# Patient Record
Sex: Female | Born: 1990 | Race: White | Hispanic: No | Marital: Single | Smoking: Never smoker
Health system: Southern US, Community
[De-identification: ages and names within clinical notes are randomized; demographics above are authoritative.]

## PROBLEM LIST (undated history)

## (undated) DIAGNOSIS — Z789 Other specified health status: Secondary | ICD-10-CM

## (undated) HISTORY — PX: NO PAST SURGERIES: SHX2092

---

## 2012-10-07 ENCOUNTER — Emergency Department (HOSPITAL_COMMUNITY): Payer: 59

## 2012-10-07 ENCOUNTER — Encounter (HOSPITAL_COMMUNITY): Payer: Self-pay | Admitting: Cardiology

## 2012-10-07 ENCOUNTER — Inpatient Hospital Stay (HOSPITAL_COMMUNITY)
Admission: EM | Admit: 2012-10-07 | Discharge: 2012-10-10 | DRG: 076 | Disposition: A | Discharge status: Home / Self Care | Payer: 59 | Attending: Internal Medicine | Admitting: Internal Medicine

## 2012-10-07 DIAGNOSIS — Z23 Encounter for immunization: Secondary | ICD-10-CM

## 2012-10-07 DIAGNOSIS — A879 Viral meningitis, unspecified: Principal | ICD-10-CM | POA: Diagnosis present

## 2012-10-07 DIAGNOSIS — G039 Meningitis, unspecified: Secondary | ICD-10-CM

## 2012-10-07 DIAGNOSIS — G030 Nonpyogenic meningitis: Secondary | ICD-10-CM

## 2012-10-07 DIAGNOSIS — E876 Hypokalemia: Secondary | ICD-10-CM | POA: Diagnosis present

## 2012-10-07 HISTORY — DX: Other specified health status: Z78.9

## 2012-10-07 LAB — CBC WITH DIFFERENTIAL/PLATELET
Eosinophils Relative: 0 % (ref 0–5)
HCT: 38.2 % (ref 36.0–46.0)
Hemoglobin: 12.9 g/dL (ref 12.0–15.0)
Lymphocytes Relative: 12 % (ref 12–46)
Lymphs Abs: 1.3 10*3/uL (ref 0.7–4.0)
MCV: 94.1 fL (ref 78.0–100.0)
Monocytes Absolute: 0.5 10*3/uL (ref 0.1–1.0)
Monocytes Relative: 5 % (ref 3–12)
RBC: 4.06 MIL/uL (ref 3.87–5.11)
RDW: 12.4 % (ref 11.5–15.5)
WBC: 10.1 10*3/uL (ref 4.0–10.5)

## 2012-10-07 LAB — URINALYSIS, ROUTINE W REFLEX MICROSCOPIC
Glucose, UA: NEGATIVE mg/dL
Hgb urine dipstick: NEGATIVE
Ketones, ur: NEGATIVE mg/dL
Protein, ur: NEGATIVE mg/dL

## 2012-10-07 LAB — BASIC METABOLIC PANEL WITH GFR
BUN: 7 mg/dL (ref 6–23)
CO2: 24 meq/L (ref 19–32)
Calcium: 8.7 mg/dL (ref 8.4–10.5)
Chloride: 104 meq/L (ref 96–112)
Creatinine, Ser: 0.6 mg/dL (ref 0.50–1.10)
GFR calc Af Amer: >90 mL/min
GFR calc non Af Amer: >90 mL/min
Glucose, Bld: 130 mg/dL — ABNORMAL HIGH (ref 70–99)
Potassium: 3.4 meq/L — ABNORMAL LOW (ref 3.5–5.1)
Sodium: 139 meq/L (ref 135–145)

## 2012-10-07 LAB — URINE MICROSCOPIC-ADD ON

## 2012-10-07 MED ORDER — KETOROLAC TROMETHAMINE 30 MG/ML IJ SOLN
30.0000 mg | Freq: Once | INTRAMUSCULAR | Status: AC
  Administered 2012-10-07: 30 mg via INTRAVENOUS
  Filled 2012-10-07: qty 1

## 2012-10-07 MED ORDER — DEXAMETHASONE SODIUM PHOSPHATE 10 MG/ML IJ SOLN
10.0000 mg | Freq: Once | INTRAMUSCULAR | Status: AC
  Administered 2012-10-07: 10 mg via INTRAVENOUS
  Filled 2012-10-07: qty 1

## 2012-10-07 MED ORDER — DIPHENHYDRAMINE HCL 50 MG/ML IJ SOLN
25.0000 mg | Freq: Once | INTRAMUSCULAR | Status: AC
  Administered 2012-10-07: 25 mg via INTRAVENOUS
  Filled 2012-10-07: qty 1

## 2012-10-07 MED ORDER — METOCLOPRAMIDE HCL 5 MG/ML IJ SOLN
10.0000 mg | Freq: Once | INTRAMUSCULAR | Status: AC
  Administered 2012-10-07: 10 mg via INTRAVENOUS
  Filled 2012-10-07: qty 2

## 2012-10-07 MED ORDER — SODIUM CHLORIDE 0.9 % IV SOLN
INTRAVENOUS | Status: DC
  Administered 2012-10-07: 20:00:00 via INTRAVENOUS

## 2012-10-07 MED ORDER — ONDANSETRON HCL 4 MG/2ML IJ SOLN
4.0000 mg | Freq: Once | INTRAMUSCULAR | Status: AC
  Administered 2012-10-07: 4 mg via INTRAVENOUS
  Filled 2012-10-07: qty 2

## 2012-10-07 NOTE — Progress Notes (Signed)
11:14 PM 22 yo woman with severe headache, neck pain, photophobia since yesterday after getting off an [employer].  No fever or chills.  No prior headache history.  General health good.  Exam shows her to be in moderate distress with headache, which she localizes to the frontal region radiating to the occipital region and into her posterior neck.  Movement of her neck causes pain.  Heart and lungs normal, abdomen soft and non-tender.  Neur:  Cr. N. Intact.  Sensory and motor intact.  Will plan to do LP to check for meningitis.  Medicated further with Reglan, dexamethasone, and Benadryl.

## 2012-10-07 NOTE — ED Notes (Signed)
Pt reports headache with n/v that started last night. Reports she has some dizziness, but no vision changes. States she feels off balance when she stands up. Steady gait to triage.

## 2012-10-07 NOTE — ED Notes (Signed)
Pt states pain is not better.  np aware new order obtained.

## 2012-10-07 NOTE — ED Provider Notes (Signed)
History     CSN: [id number]  Arrival date & time 10/07/12  1743   First MD Initiated Contact with Patient 10/07/12 1855      Chief Complaint  Patient presents with  . Headache  . Nausea  . Emesis    (Consider location/radiation/quality/duration/timing/severity/associated sxs/prior treatment) Patient is a 22 y.o. female presenting with headaches and vomiting. The history is provided by the patient. No language interpreter was used.  Headache Pain location:  Frontal Radiates to: neck. Onset quality:  Gradual Duration:  18 hours Timing:  Constant Progression:  Worsening Chronicity:  New Similar to prior headaches: no   Context: bright light   Relieved by:  None tried Ineffective treatments:  None tried Associated symptoms: dizziness, fatigue, nausea, neck pain, neck stiffness, photophobia and vomiting   Associated symptoms: no fever and no swollen glands   Emesis Associated symptoms: headaches     History reviewed. No pertinent past medical history.  History reviewed. No pertinent past surgical history.  History reviewed. No pertinent family history.  History  Substance Use Topics  . Smoking status: Never Smoker   . Smokeless tobacco: Not on file  . Alcohol Use: Yes    OB History   Grav Para Term Preterm Abortions TAB SAB Ect Mult Living                  Review of Systems  Constitutional: Positive for fatigue. Negative for fever.  HENT: Positive for neck pain and neck stiffness.   Eyes: Positive for photophobia.  Gastrointestinal: Positive for nausea and vomiting.  Skin: Negative for rash.  Neurological: Positive for dizziness and headaches.  All other systems reviewed and are negative.    Allergies  Review of patient's allergies indicates no known allergies.  Home Medications   Current Outpatient Rx  Name  Route  Sig  Dispense  Refill  . aspirin-acetaminophen-caffeine (EXCEDRIN MIGRAINE) 250-250-65 MG per tablet   Oral   Take 1 tablet by mouth  every 6 (six) hours as needed for pain. For headache pain           BP 141/88  Pulse 86  Temp(Src) 98.4 F (36.9 C) (Oral)  Resp 20  SpO2 97%  Physical Exam  Nursing note and vitals reviewed. Constitutional: She is oriented to person, place, and time. She appears well-developed and well-nourished.  HENT:  Head: Normocephalic and atraumatic.  Eyes: Pupils are equal, round, and reactive to light.  Neck: Normal range of motion.  Increased pain to posterior neck when touching chin to chest  Cardiovascular: Normal rate.   Pulmonary/Chest: Effort normal. She has wheezes.  Abdominal: Soft. Bowel sounds are normal.  Musculoskeletal: Normal range of motion. She exhibits no edema and no tenderness.  Lymphadenopathy:    She has no cervical adenopathy.  Neurological: She is alert and oriented to person, place, and time. No cranial nerve deficit.  Skin: Skin is warm and dry. No rash noted.  Psychiatric: She has a normal mood and affect. Her behavior is normal. Judgment and thought content normal.    ED Course  Procedures (including critical care time)  Labs Reviewed  CBC WITH DIFFERENTIAL  BASIC METABOLIC PANEL  URINALYSIS, ROUTINE W REFLEX MICROSCOPIC   No results found.   No diagnosis found.  Patient discussed with and seen by Dr. Ignacia Palma.  Patient will need LP.  Attempt in ED unsuccessful, radiology to perform under flouro guidance.  MDM          Jimmye Norman, NP  10/08/12 0108 

## 2012-10-08 ENCOUNTER — Encounter (HOSPITAL_COMMUNITY): Payer: Self-pay | Admitting: Internal Medicine

## 2012-10-08 ENCOUNTER — Emergency Department (HOSPITAL_COMMUNITY): Payer: 59

## 2012-10-08 DIAGNOSIS — A879 Viral meningitis, unspecified: Secondary | ICD-10-CM

## 2012-10-08 DIAGNOSIS — E876 Hypokalemia: Secondary | ICD-10-CM

## 2012-10-08 DIAGNOSIS — G030 Nonpyogenic meningitis: Secondary | ICD-10-CM | POA: Diagnosis present

## 2012-10-08 LAB — CSF CELL COUNT WITH DIFFERENTIAL
Eosinophils, CSF: 1 % (ref 0–1)
Lymphs, CSF: 79 % (ref 40–80)
Monocyte-Macrophage-Spinal Fluid: 11 % — ABNORMAL LOW (ref 15–45)
RBC Count, CSF: 13 /mm3 — ABNORMAL HIGH
Segmented Neutrophils-CSF: 5 % (ref 0–6)
Tube #: 4

## 2012-10-08 LAB — GRAM STAIN

## 2012-10-08 MED ORDER — DEXTROSE 5 % IV SOLN
2.0000 g | Freq: Once | INTRAVENOUS | Status: AC
  Administered 2012-10-08: 2 g via INTRAVENOUS
  Filled 2012-10-08: qty 2

## 2012-10-08 MED ORDER — ACETAMINOPHEN 325 MG PO TABS
650.0000 mg | ORAL_TABLET | Freq: Four times a day (QID) | ORAL | Status: DC | PRN
  Administered 2012-10-08 – 2012-10-09 (×3): 650 mg via ORAL
  Filled 2012-10-08: qty 1
  Filled 2012-10-08: qty 2

## 2012-10-08 MED ORDER — ONDANSETRON HCL 4 MG/2ML IJ SOLN: 4.0000 mg | Freq: Four times a day (QID) | INTRAMUSCULAR | Status: DC | PRN

## 2012-10-08 MED ORDER — VANCOMYCIN HCL IN DEXTROSE 1-5 GM/200ML-% IV SOLN
1000.0000 mg | Freq: Once | INTRAVENOUS | Status: AC
  Administered 2012-10-08: 1000 mg via INTRAVENOUS
  Filled 2012-10-08: qty 200

## 2012-10-08 MED ORDER — ACETAMINOPHEN 325 MG PO TABS
975.0000 mg | ORAL_TABLET | Freq: Four times a day (QID) | ORAL | Status: DC | PRN
  Filled 2012-10-08: qty 3

## 2012-10-08 MED ORDER — POTASSIUM CHLORIDE CRYS ER 20 MEQ PO TBCR
40.0000 meq | EXTENDED_RELEASE_TABLET | Freq: Once | ORAL | Status: AC
  Administered 2012-10-08: 40 meq via ORAL
  Filled 2012-10-08: qty 2

## 2012-10-08 MED ORDER — ACETAMINOPHEN 650 MG RE SUPP: 650.0000 mg | Freq: Four times a day (QID) | RECTAL | Status: DC | PRN

## 2012-10-08 MED ORDER — INFLUENZA VIRUS VACC SPLIT PF IM SUSP: 0.5000 mL | INTRAMUSCULAR | Status: DC | PRN

## 2012-10-08 MED ORDER — SODIUM CHLORIDE 0.9 % IV SOLN
INTRAVENOUS | Status: DC
  Administered 2012-10-08: 06:00:00 via INTRAVENOUS

## 2012-10-08 MED ORDER — ALBUTEROL SULFATE (5 MG/ML) 0.5% IN NEBU: 2.5000 mg | INHALATION_SOLUTION | RESPIRATORY_TRACT | Status: DC | PRN

## 2012-10-08 MED ORDER — SODIUM CHLORIDE 0.9 % IV SOLN
INTRAVENOUS | Status: DC
  Administered 2012-10-08: 19:00:00 via INTRAVENOUS

## 2012-10-08 MED ORDER — ONDANSETRON HCL 4 MG PO TABS: 4.0000 mg | ORAL_TABLET | Freq: Four times a day (QID) | ORAL | Status: DC | PRN

## 2012-10-08 NOTE — Procedures (Signed)
Lumbar puncture performed at L3-L4 interspace.  Opening pressure 20 cm H20.  No complications.  Full dictation in PACS.

## 2012-10-08 NOTE — ED Provider Notes (Signed)
Medical screening examination/treatment/procedure(s) were conducted as a shared visit with non-physician practitioner(s) and myself.  I personally evaluated the patient during the encounter 22 yo woman with severe headache, neck pain, photophobia since yesterday after getting off an airplane flight. No fever or chills. No prior headache history. General health good. Exam shows her to be in moderate distress with headache, which she localizes to the frontal region radiating to the occipital region and into her posterior neck. Movement of her neck causes pain. Heart and lungs normal, abdomen soft and non-tender. Neur: Cr. N. Intact. Sensory and motor intact. Will plan to do LP to check for meningitis. Medicated further with Reglan, dexamethasone, and Benadryl.    Carleene Cooper III, MD 10/08/12 [phone]

## 2012-10-08 NOTE — ED Provider Notes (Signed)
Patient care assumed from Dr. Ignacia Palma.  He had attempted lumbar puncture for meningismus, and unable and sent patient to radiology to have it done under fluoroscopy. Patient is symptomatic with headache and neck stiffness or 24 hours. No fevers. No rash. No known sick contacts. Radiologist was able to obtain CSF and results reviewed as below. IV antibiotics initiated. Medicine consult.  Results for orders placed during the hospital encounter of 10/07/12  CBC WITH DIFFERENTIAL      Result Value Range   WBC 10.1  4.0 - 10.5 K/uL   RBC 4.06  3.87 - 5.11 MIL/uL   Hemoglobin 12.9  12.0 - 15.0 g/dL   HCT 16.1  09.6 - 04.5 %   MCV 94.1  78.0 - 100.0 fL   MCH 31.8  26.0 - 34.0 pg   MCHC 33.8  30.0 - 36.0 g/dL   RDW 40.9  81.1 - 91.4 %   Platelets 300  150 - 400 K/uL   Neutrophils Relative 82 (*) 43 - 77 %   Neutro Abs 8.3 (*) 1.7 - 7.7 K/uL   Lymphocytes Relative 12  12 - 46 %   Lymphs Abs 1.3  0.7 - 4.0 K/uL   Monocytes Relative 5  3 - 12 %   Monocytes Absolute 0.5  0.1 - 1.0 K/uL   Eosinophils Relative 0  0 - 5 %   Eosinophils Absolute 0.0  0.0 - 0.7 K/uL   Basophils Relative 0  0 - 1 %   Basophils Absolute 0.0  0.0 - 0.1 K/uL  BASIC METABOLIC PANEL      Result Value Range   Sodium 139  135 - 145 mEq/L   Potassium 3.4 (*) 3.5 - 5.1 mEq/L   Chloride 104  96 - 112 mEq/L   CO2 24  19 - 32 mEq/L   Glucose, Bld 130 (*) 70 - 99 mg/dL   BUN 7  6 - 23 mg/dL   Creatinine, Ser 7.82  0.50 - 1.10 mg/dL   Calcium 8.7  8.4 - 95.6 mg/dL   GFR calc non Af Amer >90  >90 mL/min   GFR calc Af Amer >90  >90 mL/min  URINALYSIS, ROUTINE W REFLEX MICROSCOPIC      Result Value Range   Color, Urine YELLOW  YELLOW   APPearance CLEAR  CLEAR   Specific Gravity, Urine 1.022  1.005 - 1.030   pH 7.5  5.0 - 8.0   Glucose, UA NEGATIVE  NEGATIVE mg/dL   Hgb urine dipstick NEGATIVE  NEGATIVE   Bilirubin Urine NEGATIVE  NEGATIVE   Ketones, ur NEGATIVE  NEGATIVE mg/dL   Protein, ur NEGATIVE  NEGATIVE mg/dL    Urobilinogen, UA 1.0  0.0 - 1.0 mg/dL   Nitrite NEGATIVE  NEGATIVE   Leukocytes, UA TRACE (*) NEGATIVE  URINE MICROSCOPIC-ADD ON      Result Value Range   Squamous Epithelial / LPF FEW (*) RARE   WBC, UA 0-2  <3 WBC/hpf   Bacteria, UA FEW (*) RARE   Urine-Other AMORPHOUS URATES/PHOSPHATES    CSF CELL COUNT WITH DIFFERENTIAL      Result Value Range   Tube # 1     Color, CSF COLORLESS  COLORLESS   Appearance, CSF CLEAR  CLEAR   Supernatant NOT INDICATED     RBC Count, CSF 215 (*) 0 /cu mm   WBC, CSF 470 (*) 0 - 5 /cu mm   Segmented Neutrophils-CSF 5  0 - 6 %   Lymphs,  CSF 83 (*) 40 - 80 %   Monocyte-Macrophage-Spinal Fluid 11 (*) 15 - 45 %   Eosinophils, CSF 1  0 - 1 %  PROTEIN AND GLUCOSE, CSF      Result Value Range   Glucose, CSF 58  43 - 76 mg/dL   Total  Protein, CSF 76 (*) 15 - 45 mg/dL  CSF CELL COUNT WITH DIFFERENTIAL      Result Value Range   Tube # 4     Color, CSF COLORLESS  COLORLESS   Appearance, CSF CLEAR  CLEAR   Supernatant NOT INDICATED     RBC Count, CSF 13 (*) 0 /cu mm   WBC, CSF 490 (*) 0 - 5 /cu mm   Segmented Neutrophils-CSF 4  0 - 6 %   Lymphs, CSF 79  40 - 80 %   Monocyte-Macrophage-Spinal Fluid 16  15 - 45 %   Eosinophils, CSF 1  0 - 1 %  POCT PREGNANCY, URINE      Result Value Range   Preg Test, Ur NEGATIVE  NEGATIVE   Ct Head Wo Contrast  10/07/2012  *RADIOLOGY REPORT*  Clinical Data: Headache, nausea and emesis.  CT HEAD WITHOUT CONTRAST  Technique:  Contiguous axial images were obtained from the base of the skull through the vertex without contrast.  Comparison: No priors.  Findings: No acute intracranial abnormalities.  Specifically, no evidence of acute intracranial hemorrhage, no definite findings of acute/subacute cerebral ischemia, no mass, mass effect, hydrocephalus or abnormal intra or extra-axial fluid collections. Visualized paranasal sinuses and mastoids are well pneumatized.  No acute displaced skull fractures are identified.   IMPRESSION: 1.  No acute intracranial abnormality. 2.  The appearance of the brain is normal.   Original Report Authenticated By: Trudie Reed, M.D.    Dg Lumbar Puncture Fluoro Guide  10/08/2012  *RADIOLOGY REPORT*  Clinical Data: Severe headache and neck stiffness.  LUMBAR PUNCTURE FLUORO GUIDE  Comparison: No priors.  Findings: Before beginning the procedure, the procedure was discussed with the patient.  The risks, benefits and alternatives of the procedure were explained, the patient voiced understanding of the discussion.  The patient gave both written and verbal consent to proceed. A time out procedure was then performed.  Subsequently, fluoroscopy was utilized to identify the L3-L4 interspace, and a small mark was placed on the patient's skin.  The patient was then prepped and draped in the usual sterile fashion. A small amount of 1% lidocaine was infiltrated to provide local anesthesia.  Under intermittent fluoroscopic guidance a spinal needle was then introduced percutaneously into the spinal canal and CSF was obtained.  Opening pressure was 20 cm of water.  A total of 18 ml of clear CSF was collected in serial vials and sent to the laboratory for testing analysis per orders of the ER physician. The needle was withdrawn from the patient.  A small bandage was placed on the patient's skin overlying the entry wound.  No significant bleeding, or other immediate complications.  The patient was transferred back to the emergency room in good condition.  IMPRESSION: 1.  Successful uncomplicated fluoroscopic guided lumbar puncture, as above.   Original Report Authenticated By: Trudie Reed, M.D.     CRITICAL CARE Performed by: Sunnie Nielsen   Total critical care time: 30  Critical care time was exclusive of separately billable procedures and treating other patients.  Critical care was necessary to treat or prevent imminent or life-threatening deterioration.  Critical care was time spent  personally by me on the following activities: development of treatment plan with patient and/or surrogate as well as nursing, discussions with consultants, evaluation of patient's response to treatment, examination of patient, obtaining history from patient or surrogate, ordering and performing treatments and interventions, ordering and review of laboratory studies, ordering and review of radiographic studies, pulse oximetry and re-evaluation of patient's condition. Case discussed as above with infectious disease Dr. Ilsa Iha - she recommends add HSV to CSF culture and agrees with IV antibiotics Rocephin and vancomycin.   Gram stain pending, CSF culture pending. Medicine consult for admission   Sunnie Nielsen, MD 10/08/12 0730

## 2012-10-08 NOTE — ED Notes (Signed)
Pt ambulatory to br

## 2012-10-08 NOTE — Procedures (Signed)
Pt has 24 hour of headache, neck pain and photophobia.  Lab tests and CT were nondiagnostic.  Pt and her parents were advised by me to have diagnostic lumbar puncture to check for meningitis, and they gave verbal consent.  Skin prepped with chlorhexadine.  LP needle inserted at appx L4-5 interspace, but only blood came back.  After this failed attempt, I requested Dr. Llana Aliment, radiologist, to perform the LP under flouroscopy.

## 2012-10-08 NOTE — Progress Notes (Signed)
Patient arrived from 2600, Alert and oriented and Oriented to the rooms, falls protocol, and droplet isolation. No concerns at this time

## 2012-10-08 NOTE — Consult Note (Addendum)
[facility]  Total days of antibiotics 1        Day 1 ceftriaxone        Day 1 vanco               Reason for Consult:aseptic meningitis    Referring Physician: [email or web address]  Principal Problem:   Aseptic meningitis Active Problems:   Hypokalemia    HPI: Joy Fisher is a 22 y.o. 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 No prior hx of migraines. She had recent antibiotic exposure roughly 7 days ago treated for URTI with what sounds like a zpack from 2/12-2/15. She  Returned on 2/20 from a 4 day trip to [facility] . She denies sick contacts. She was taking 400mg  Ibuprofen on daily basis x 3 days for sore ankle. While on the plane returning back home on Thursday night, she started experiencing gradual onset of frontal headache which progressively got worse yesterday to 8/10 in severity and radiated to the rest of the head, neck and back. She took excedrin which helped her symptoms x 2 hrs. But then HA came back. This was associated with photophobia.. One episode of vomiting which was nonprojectile and did not have coffee-ground or blood.  She denies earache, sore throat, cough, skin rash or insect bites. On admit, she was afebrile, WBC, at 10 with 81N, underwent NCHCT, attempted bedside LP but eventually had LP by IR under fluoroscopy with OP of 20, cell count shows WBC 470 with 79%L, TP 76, glu 56/while pGlu at 130. Gram stain was negative for organisms.She was started on vancomycin and ceftriaxone. . Patient says that her headache is much better. She complains of mild pain at the LP site. Denies any recent cold sore, althgouh had one associated with sunburn roughly 2 yrs ago. No hx of hsv infection.   Past Medical History  Diagnosis Date  . Medical history non-contributory   - adhd, prn adderal  Allergies: No Known Allergies  MEDICATIONS:    History  Substance Use  Topics  . Smoking status: Never Smoker   . Smokeless tobacco: Not on file  . Alcohol Use: Yes    Family History  Problem Relation Age of Onset  . CVA Father     Review of Systems  Constitutional: Negative for fever, chills, diaphoresis, activity change, appetite change, fatigue and unexpected weight change.  HENT: Negative for congestion, sore throat, rhinorrhea, sneezing, trouble swallowing and sinus pressure.  Eyes: Negative for photophobia and visual disturbance.  Respiratory: Negative for cough, chest tightness, shortness of breath, wheezing and stridor.  Cardiovascular: Negative for chest pain, palpitations and leg swelling.  Gastrointestinal: Negative for nausea, vomiting, abdominal pain, diarrhea, constipation, blood in stool, abdominal distention and anal bleeding.  Genitourinary: Negative for dysuria, hematuria, flank pain and difficulty urinating.  Musculoskeletal: neck pain per hpi Skin: Negative for color change, pallor, rash and wound.  Neurological: HA per hpi  Hematological: Negative for adenopathy. Does not bruise/bleed easily.  Psychiatric/Behavioral: Negative for behavioral problems, confusion, sleep disturbance, dysphoric mood, decreased concentration and agitation.     OBJECTIVE: Temp:  [98.4 F (36.9 C)-99.2 F (37.3 C)] 98.6 F (37 C) (02/22 0827) Pulse Rate:  [60-94] 75 (02/22 1100) Resp:  [16-29] 22 (02/22 1100) BP: (111-141)/(49-88) 125/67 mmHg (02/22 0827) SpO2:  [96 %-100 %] 96 % (02/22 1100) Weight:  [186 lb 11.7 oz (84.7 kg)] 186  lb 11.7 oz (84.7 kg) (02/22 4782) Physical Exam  Constitutional:  oriented to person, place, and time. appears well-developed and well-nourished. No distress.  HENT:  Mouth/Throat: Oropharynx is clear and moist. No oropharyngeal exudate. No nuchal rigidity Cardiovascular: Normal rate, regular rhythm and normal heart sounds. Exam reveals no gallop and no friction rub.  No murmur heard.  Pulmonary/Chest: Effort normal and  breath sounds normal. No respiratory distress.no wheezes.  Abdominal: Soft. Bowel sounds are normal. exhibits no distension. There is no tenderness.  Lymphadenopathy:  no cervical adenopathy.  Neurological: He is alert and oriented to person, place, and time.  Skin: Skin is warm and dry. No rash noted. No erythema.  Psychiatric:  normal mood and affect. His behavior is normal.    LABS: Results for orders placed during the hospital encounter of 10/07/12 (from the past 48 hour(s))  CBC WITH DIFFERENTIAL     Status: Abnormal   Collection Time    10/07/12  7:14 PM      Result Value Range   WBC 10.1  4.0 - 10.5 K/uL   RBC 4.06  3.87 - 5.11 MIL/uL   Hemoglobin 12.9  12.0 - 15.0 g/dL   HCT 95.6  21.3 - 08.6 %   MCV 94.1  78.0 - 100.0 fL   MCH 31.8  26.0 - 34.0 pg   MCHC 33.8  30.0 - 36.0 g/dL   RDW 57.8  46.9 - 62.9 %   Platelets 300  150 - 400 K/uL   Neutrophils Relative 82 (*) 43 - 77 %   Neutro Abs 8.3 (*) 1.7 - 7.7 K/uL   Lymphocytes Relative 12  12 - 46 %   Lymphs Abs 1.3  0.7 - 4.0 K/uL   Monocytes Relative 5  3 - 12 %   Monocytes Absolute 0.5  0.1 - 1.0 K/uL   Eosinophils Relative 0  0 - 5 %   Eosinophils Absolute 0.0  0.0 - 0.7 K/uL   Basophils Relative 0  0 - 1 %   Basophils Absolute 0.0  0.0 - 0.1 K/uL  BASIC METABOLIC PANEL     Status: Abnormal   Collection Time    10/07/12  7:14 PM      Result Value Range   Sodium 139  135 - 145 mEq/L   Potassium 3.4 (*) 3.5 - 5.1 mEq/L   Chloride 104  96 - 112 mEq/L   CO2 24  19 - 32 mEq/L   Glucose, Bld 130 (*) 70 - 99 mg/dL   BUN 7  6 - 23 mg/dL   Creatinine, Ser 5.28  0.50 - 1.10 mg/dL   Calcium 8.7  8.4 - 41.3 mg/dL   GFR calc non Af Amer >90  >90 mL/min   GFR calc Af Amer >90  >90 mL/min   Comment:            The eGFR has been calculated     using the CKD EPI equation.     This calculation has not been     validated in all clinical     situations.     eGFR's persistently     <90 mL/min signify     possible Chronic  Kidney Disease.  URINALYSIS, ROUTINE W REFLEX MICROSCOPIC     Status: Abnormal   Collection Time    10/07/12  7:32 PM      Result Value Range   Color, Urine YELLOW  YELLOW   APPearance CLEAR  CLEAR   Specific  Gravity, Urine 1.022  1.005 - 1.030   pH 7.5  5.0 - 8.0   Glucose, UA NEGATIVE  NEGATIVE mg/dL   Hgb urine dipstick NEGATIVE  NEGATIVE   Bilirubin Urine NEGATIVE  NEGATIVE   Ketones, ur NEGATIVE  NEGATIVE mg/dL   Protein, ur NEGATIVE  NEGATIVE mg/dL   Urobilinogen, UA 1.0  0.0 - 1.0 mg/dL   Nitrite NEGATIVE  NEGATIVE   Leukocytes, UA TRACE (*) NEGATIVE  URINE MICROSCOPIC-ADD ON     Status: Abnormal   Collection Time    10/07/12  7:32 PM      Result Value Range   Squamous Epithelial / LPF FEW (*) RARE   WBC, UA 0-2  <3 WBC/hpf   Bacteria, UA FEW (*) RARE   Urine-Other AMORPHOUS URATES/PHOSPHATES    POCT PREGNANCY, URINE     Status: None   Collection Time    10/07/12  7:38 PM      Result Value Range   Preg Test, Ur NEGATIVE  NEGATIVE   Comment:            THE SENSITIVITY OF THIS     METHODOLOGY IS >24 mIU/mL  PROTEIN AND GLUCOSE, CSF     Status: Abnormal   Collection Time    10/08/12  2:54 AM      Result Value Range   Glucose, CSF 58  43 - 76 mg/dL   Total  Protein, CSF 76 (*) 15 - 45 mg/dL  GRAM STAIN     Status: None   Collection Time    10/08/12  2:54 AM      Result Value Range   Specimen Description CSF     Special Requests NONE     Gram Stain       Value: CYTOSPIN SAMPLE     WBC PRESENT,BOTH PMN AND MONONUCLEAR     NO ORGANISMS SEEN   Report Status 10/08/2012 FINAL    CSF CELL COUNT WITH DIFFERENTIAL     Status: Abnormal   Collection Time    10/08/12  2:58 AM      Result Value Range   Tube # 1     Color, CSF COLORLESS  COLORLESS   Appearance, CSF CLEAR  CLEAR   Supernatant NOT INDICATED     RBC Count, CSF 215 (*) 0 /cu mm   WBC, CSF 470 (*) 0 - 5 /cu mm   Comment: CRITICAL RESULT CALLED TO, READ BACK BY AND VERIFIED WITH:     Vanessa Kick [id number]  10/08/12 D BRADLEY   Segmented Neutrophils-CSF 5  0 - 6 %   Lymphs, CSF 83 (*) 40 - 80 %   Monocyte-Macrophage-Spinal Fluid 11 (*) 15 - 45 %   Eosinophils, CSF 1  0 - 1 %  CSF CELL COUNT WITH DIFFERENTIAL     Status: Abnormal   Collection Time    10/08/12  2:58 AM      Result Value Range   Tube # 4     Color, CSF COLORLESS  COLORLESS   Appearance, CSF CLEAR  CLEAR   Supernatant NOT INDICATED     RBC Count, CSF 13 (*) 0 /cu mm   WBC, CSF 490 (*) 0 - 5 /cu mm   Comment: CRITICAL RESULT CALLED TO, READ BACK BY AND VERIFIED WITH:     J MUNCY,RN 1191 10/08/12 D BRADLEY   Segmented Neutrophils-CSF 4  0 - 6 %   Lymphs, CSF 79  40 - 80 %  Monocyte-Macrophage-Spinal Fluid 16  15 - 45 %   Eosinophils, CSF 1  0 - 1 %  MRSA PCR SCREENING     Status: None   Collection Time    10/08/12  6:30 AM      Result Value Range   MRSA by PCR NEGATIVE  NEGATIVE   Comment:            The GeneXpert MRSA Assay (FDA     approved for NASAL specimens     only), is one component of a     comprehensive MRSA colonization     surveillance program. It is not     intended to diagnose MRSA     infection nor to guide or     monitor treatment for     MRSA infections.    MICRO: 2/22 csf :gram stain negative; culture pending  IMAGING: Ct Head Wo Contrast  10/07/2012  *RADIOLOGY REPORT*  Clinical Data: Headache, nausea and emesis.  CT HEAD WITHOUT CONTRAST  Technique:  Contiguous axial images were obtained from the base of the skull through the vertex without contrast.  Comparison: No priors.  Findings: No acute intracranial abnormalities.  Specifically, no evidence of acute intracranial hemorrhage, no definite findings of acute/subacute cerebral ischemia, no mass, mass effect, hydrocephalus or abnormal intra or extra-axial fluid collections. Visualized paranasal sinuses and mastoids are well pneumatized.  No acute displaced skull fractures are identified.  IMPRESSION: 1.  No acute intracranial abnormality. 2.  The  appearance of the brain is normal.   Original Report Authenticated By: Trudie Reed, M.D.    Dg Lumbar Puncture Fluoro Guide  10/08/2012  *RADIOLOGY REPORT*  Clinical Data: Severe headache and neck stiffness.  LUMBAR PUNCTURE FLUORO GUIDE  Comparison: No priors.  Findings: Before beginning the procedure, the procedure was discussed with the patient.  The risks, benefits and alternatives of the procedure were explained, the patient voiced understanding of the discussion.  The patient gave both written and verbal consent to proceed. A time out procedure was then performed.  Subsequently, fluoroscopy was utilized to identify the L3-L4 interspace, and a small mark was placed on the patient's skin.  The patient was then prepped and draped in the usual sterile fashion. A small amount of 1% lidocaine was infiltrated to provide local anesthesia.  Under intermittent fluoroscopic guidance a spinal needle was then introduced percutaneously into the spinal canal and CSF was obtained.  Opening pressure was 20 cm of water.  A total of 18 ml of clear CSF was collected in serial vials and sent to the laboratory for testing analysis per orders of the ER physician. The needle was withdrawn from the patient.  A small bandage was placed on the patient's skin overlying the entry wound.  No significant bleeding, or other immediate complications.  The patient was transferred back to the emergency room in good condition.  IMPRESSION: 1.  Successful uncomplicated fluoroscopic guided lumbar puncture, as above.   Original Report Authenticated By: Trudie Reed, M.D.    Assessment/Plan:  22yo F with ongoing headache with meningmus sans fever underwent LP found to have lymphocytic pleocytosis, WBC 470, 79%L, mildly elevated protein, normal glucose. Gram stain negative c/w aseptic meningitis. ddx could be HSV-2, or drug associated aseptic meningitis, as with NSAID use. Not the season for enterovirus or arborvirus or West Nile  Virus  - continue with supportive care - will check HIV ab, RPR with am labs - await HSV PCR of CSF  - can discontinue droplet  isolation - will follow up on culture results to determine if any need for antiviral  Female Minish B. Drue Second MD MPH [facility] [phone]

## 2012-10-08 NOTE — ED Notes (Signed)
Pt returned from radiology. States feeling much better.

## 2012-10-08 NOTE — ED Notes (Signed)
Pt to radiology for lp.

## 2012-10-08 NOTE — H&P (Signed)
[facility] History and Physical  Chapel [name] ZOX:[id number] DOB: 04-03-1991 DOA: 10/07/2012  Referring physician: Dr. Sunnie Nielsen, EDP PCP: Default, Provider, MD  Specialists: None  Chief Complaint: Headache, neck pain and photophobia  HPI: Joy Fisher is a 22 y.o. female  with no PMH, student at [employer], presented to ED on 10/07/12 with one-day history of headache, neck pain and photophobia. She was treated for URTI with a 4 day course of unknown antibiotic approximately a week ago. She then traveled with her classmates to [facility] on a trip for 4 days. She denies sickly contacts. While on the plane returning back home on Thursday night, she started experiencing gradual onset of frontal headache which progressively got worse yesterday to 8/10 in severity and radiated to the rest of the head, neck and back. This was associated with photophobia. No fever or chills. One episode of vomiting which was nonprojectile and did not have coffee-ground or blood. A dose of Excedrin Migraine temporarily eased her symptoms but returned. She denies earache, sore throat, cough, skin rash or insect bites. She presented to the ED where she was afebrile, no leukocytosis, CT head negative. Lumbar puncture was done by IR under fluoroscopy which shows  Glucose 58, protein 76, RBC 215 on first tube and 13 on the fourth 2, WBC 470 predominant lymphocytes and monocytes. Gram stain was negative for organisms. Hospitalist service is requested to admit for further evaluation and management. Patient says that her headache is much better. She complains of mild pain at the LP site. ID was consulted by EDP.   Review of Systems: All systems were reviewed and apart from history of presenting illness, negative .  Past Medical History  Diagnosis Date  . Medical history non-contributory    Past Surgical History  Procedure Laterality Date  . No past surgeries     Social History:  reports that she has never smoked.  She does not have any smokeless tobacco history on file. She reports that  drinks alcohol. She reports that she does not use illicit drugs. Independent of activities of daily living. Occasional alcohol intake.   No Known Allergies  Family History  Problem Relation Age of Onset  . CVA Father     Prior to Admission medications   Medication Sig Start Date End Date Taking? Authorizing Provider  aspirin-acetaminophen-caffeine (EXCEDRIN MIGRAINE) [phone] MG per tablet Take 1 tablet by mouth every 6 (six) hours as needed for pain. For headache pain   Yes Historical Provider, MD   Physical Exam: Filed Vitals:   10/08/12 0303 10/08/12 0435 10/08/12 0620 10/08/12 0827  BP: 112/61 111/49 125/70 125/67  Pulse: 80 66 60 68  Temp:  99.2 F (37.3 C) 98.6 F (37 C) 98.6 F (37 C)  TempSrc:  Oral Oral Oral  Resp: 16 16 16 23   Height:   5\' 5"  (1.651 m)   Weight:   84.7 kg (186 lb 11.7 oz)   SpO2: 99% 99% 98% 97%     General exam: Well built and nourished young female patient lying comfortably supine in bed without any distress.  Head, eyes and ENT: Nontraumatic and normocephalic. Pupils equally reacting to light and accommodation. Mild photophobia. Oral mucosa slightly dry.  Neck: Supple. No JVD, carotid bruits or thyromegaly.  Lymphatics: No lymphadenopathy.  Respiratory system: Clear. No increased work of breathing.  Cardiovascular system: S1 and S2 heard, RRR. No JVD, murmurs, gallops or pedal edema. Telemetry shows normal sinus rhythm.  Gastrointestinal system: Abdomen is nondistended,  soft and nontender. Normal bowel sounds heard.  Central nervous system: Alert and oriented. No focal neurological deficits.  Gastrointestinal system: Abdomen is nondistended, soft and nontender. Normal bowel sounds heard. No organomegaly or masses appreciated.  Extremities: Symmetric 5 x 5 power. If her pulses symmetrically felt.  Skin: No acute findings on examination of exposed  skin.  Musculoskeletal system: LP site without acute findings.  Psychiatry: Pleasant and cooperative.    Labs on Admission:  Basic Metabolic Panel:  Recent Labs Lab 10/07/12 1914  NA 139  K 3.4*  CL 104  CO2 24  GLUCOSE 130*  BUN 7  CREATININE 0.60  CALCIUM 8.7   Liver Function Tests: No results found for this basename: AST, ALT, ALKPHOS, BILITOT, PROT, ALBUMIN,  in the last 168 hours No results found for this basename: LIPASE, AMYLASE,  in the last 168 hours No results found for this basename: AMMONIA,  in the last 168 hours CBC:  Recent Labs Lab 10/07/12 1914  WBC 10.1  NEUTROABS 8.3*  HGB 12.9  HCT 38.2  MCV 94.1  PLT 300   Cardiac Enzymes: No results found for this basename: CKTOTAL, CKMB, CKMBINDEX, TROPONINI,  in the last 168 hours  BNP (last 3 results) No results found for this basename: PROBNP,  in the last 8760 hours CBG: No results found for this basename: GLUCAP,  in the last 168 hours  Radiological Exams on Admission: Ct Head Wo Contrast  10/07/2012  *RADIOLOGY REPORT*  Clinical Data: Headache, nausea and emesis.  CT HEAD WITHOUT CONTRAST  Technique:  Contiguous axial images were obtained from the base of the skull through the vertex without contrast.  Comparison: No priors.  Findings: No acute intracranial abnormalities.  Specifically, no evidence of acute intracranial hemorrhage, no definite findings of acute/subacute cerebral ischemia, no mass, mass effect, hydrocephalus or abnormal intra or extra-axial fluid collections. Visualized paranasal sinuses and mastoids are well pneumatized.  No acute displaced skull fractures are identified.  IMPRESSION: 1.  No acute intracranial abnormality. 2.  The appearance of the brain is normal.   Original Report Authenticated By: Trudie Reed, M.D.    Dg Lumbar Puncture Fluoro Guide  10/08/2012  *RADIOLOGY REPORT*  Clinical Data: Severe headache and neck stiffness.  LUMBAR PUNCTURE FLUORO GUIDE  Comparison: No  priors.  Findings: Before beginning the procedure, the procedure was discussed with the patient.  The risks, benefits and alternatives of the procedure were explained, the patient voiced understanding of the discussion.  The patient gave both written and verbal consent to proceed. A time out procedure was then performed.  Subsequently, fluoroscopy was utilized to identify the L3-L4 interspace, and a small mark was placed on the patient's skin.  The patient was then prepped and draped in the usual sterile fashion. A small amount of 1% lidocaine was infiltrated to provide local anesthesia.  Under intermittent fluoroscopic guidance a spinal needle was then introduced percutaneously into the spinal canal and CSF was obtained.  Opening pressure was 20 cm of water.  A total of 18 ml of clear CSF was collected in serial vials and sent to the laboratory for testing analysis per orders of the ER physician. The needle was withdrawn from the patient.  A small bandage was placed on the patient's skin overlying the entry wound.  No significant bleeding, or other immediate complications.  The patient was transferred back to the emergency room in good condition.  IMPRESSION: 1.  Successful uncomplicated fluoroscopic guided lumbar puncture, as above.  Original Report Authenticated By: Trudie Reed, M.D.      Assessment/Plan Principal Problem:   Aseptic meningitis Active Problems:   Hypokalemia   1. Aseptic meningitis: Patient is stable and does not look septic or toxic. RBC's seen on CSF-no comment on whether it was a traumatic tap or not. Patient looks quite well for HSV meningitis. ID has been consulted. Discussed with Dr. Drue Second, ID who recommends holding off on antiviral medications at this time. Monitor closely. Followup final CSF results and CSF HSV PCR. Requested HIV antibody test. 2. Hypokalemia: Likely secondary to episode of vomiting and poor oral intake. Brief IV fluids and replete potassium and follow  BMP in am.    Code Status: Full  Family Communication: Discussed with parents at bedside. Disposition Plan: Home when medical stable.  Time spent: 60 minutes.  [facility] Pager [phone]  If 7PM-7AM, please contact night-coverage [email or web address] Password [facility] 10/08/2012, 8:36 AM

## 2012-10-09 LAB — BASIC METABOLIC PANEL
BUN: 11 mg/dL (ref 6–23)
Chloride: 107 mEq/L (ref 96–112)
Creatinine, Ser: 0.58 mg/dL (ref 0.50–1.10)
Glucose, Bld: 96 mg/dL (ref 70–99)
Potassium: 4 mEq/L (ref 3.5–5.1)

## 2012-10-09 LAB — HIV ANTIBODY (ROUTINE TESTING W REFLEX): HIV: NONREACTIVE

## 2012-10-09 LAB — PATHOLOGIST SMEAR REVIEW: Path Review: REACTIVE

## 2012-10-09 MED ORDER — ZOLPIDEM TARTRATE 5 MG PO TABS
5.0000 mg | ORAL_TABLET | Freq: Once | ORAL | Status: AC
  Administered 2012-10-09: 5 mg via ORAL
  Filled 2012-10-09: qty 1

## 2012-10-09 NOTE — Progress Notes (Signed)
[facility] PROGRESS NOTE  Joy Fisher XBJ:[id number] DOB: 01-Jul-1991 DOA: 10/07/2012 PCP: Default, Provider, MD  HPI/Subjective: Denies any headache, neck stiffness or fever   Assessment/Plan:  Aseptic meningitis -LP showed elevated protein of 76 and WBC of 490 with lymphocytosis. -These findings consistent with aseptic meningitis, patient is still on Rocephin and vancomycin. Afebrile currently. -Infectious disease was consulted, deferred to use antivirals -CSF HSV PCR is pending.  Hypokalemia -Replete with oral supplementation, resolved.  Code Status: Full code Family Communication: Discussed with the patient Disposition Plan: Remains as inpatient   Consultants:  Drue Second of infectious disease  Procedures:  LP done under fluoroscopy guidance by Dr. Llana Aliment on 10/08/2012  Antibiotics:  Rocephin and vancomycin started on 10/08/2012   Objective: Filed Vitals:   10/08/12 1100 10/08/12 1433 10/08/12 2053 10/09/12 0525  BP:  118/76 116/73 97/61  Pulse: 75 75 86 88  Temp:  97.8 F (36.6 C) 97.4 F (36.3 C) 97.8 F (36.6 C)  TempSrc:  Oral Oral Oral  Resp: 22 18 16 16   Height:      Weight:      SpO2: 96% 100% 98% 98%    Intake/Output Summary (Last 24 hours) at 10/09/12 1025 Last data filed at 10/09/12 0634  Gross per 24 hour  Intake 578.34 ml  Output      0 ml  Net 578.34 ml   Filed Weights   10/08/12 0620  Weight: 84.7 kg (186 lb 11.7 oz)    Exam:  General: Alert and awake, oriented x3, not in any acute distress. HEENT: anicteric sclera, pupils reactive to light and accommodation, EOMI CVS: S1-S2 clear, no murmur rubs or gallops Chest: clear to auscultation bilaterally, no wheezing, rales or rhonchi Abdomen: soft nontender, nondistended, normal bowel sounds, no organomegaly Extremities: no cyanosis, clubbing or edema noted bilaterally Neuro: Cranial nerves II-XII intact, no focal neurological deficits   Data Reviewed: Basic Metabolic  Panel:  Recent Labs Lab 10/07/12 1914 10/09/12 0745  NA 139 139  K 3.4* 4.0  CL 104 107  CO2 24 22  GLUCOSE 130* 96  BUN 7 11  CREATININE 0.60 0.58  CALCIUM 8.7 8.6   Liver Function Tests: No results found for this basename: AST, ALT, ALKPHOS, BILITOT, PROT, ALBUMIN,  in the last 168 hours No results found for this basename: LIPASE, AMYLASE,  in the last 168 hours No results found for this basename: AMMONIA,  in the last 168 hours CBC:  Recent Labs Lab 10/07/12 1914  WBC 10.1  NEUTROABS 8.3*  HGB 12.9  HCT 38.2  MCV 94.1  PLT 300   Cardiac Enzymes: No results found for this basename: CKTOTAL, CKMB, CKMBINDEX, TROPONINI,  in the last 168 hours BNP (last 3 results) No results found for this basename: PROBNP,  in the last 8760 hours CBG: No results found for this basename: GLUCAP,  in the last 168 hours  Recent Results (from the past 240 hour(s))  GRAM STAIN     Status: None   Collection Time    10/08/12  2:54 AM      Result Value Range Status   Specimen Description CSF   Final   Special Requests NONE   Final   Gram Stain     Final   Value: CYTOSPIN SAMPLE     WBC PRESENT,BOTH PMN AND MONONUCLEAR     NO ORGANISMS SEEN   Report Status 10/08/2012 FINAL   Final  CSF CULTURE     Status: None   Collection Time  10/08/12  3:03 AM      Result Value Range Status   Specimen Description CSF   Final   Special Requests Normal   Final   Gram Stain     Final   Value: CYTOSPIN WBC PRESENT, PREDOMINANTLY MONONUCLEAR     NO ORGANISMS SEEN   Culture PENDING   Incomplete   Report Status PENDING   Incomplete  MRSA PCR SCREENING     Status: None   Collection Time    10/08/12  6:30 AM      Result Value Range Status   MRSA by PCR NEGATIVE  NEGATIVE Final   Comment:            The GeneXpert MRSA Assay (FDA     approved for NASAL specimens     only), is one component of a     comprehensive MRSA colonization     surveillance program. It is not     intended to diagnose  MRSA     infection nor to guide or     monitor treatment for     MRSA infections.     Studies: Ct Head Wo Contrast  10/07/2012  *RADIOLOGY REPORT*  Clinical Data: Headache, nausea and emesis.  CT HEAD WITHOUT CONTRAST  Technique:  Contiguous axial images were obtained from the base of the skull through the vertex without contrast.  Comparison: No priors.  Findings: No acute intracranial abnormalities.  Specifically, no evidence of acute intracranial hemorrhage, no definite findings of acute/subacute cerebral ischemia, no mass, mass effect, hydrocephalus or abnormal intra or extra-axial fluid collections. Visualized paranasal sinuses and mastoids are well pneumatized.  No acute displaced skull fractures are identified.  IMPRESSION: 1.  No acute intracranial abnormality. 2.  The appearance of the brain is normal.   Original Report Authenticated By: Trudie Reed, M.D.    Dg Lumbar Puncture Fluoro Guide  10/08/2012  *RADIOLOGY REPORT*  Clinical Data: Severe headache and neck stiffness.  LUMBAR PUNCTURE FLUORO GUIDE  Comparison: No priors.  Findings: Before beginning the procedure, the procedure was discussed with the patient.  The risks, benefits and alternatives of the procedure were explained, the patient voiced understanding of the discussion.  The patient gave both written and verbal consent to proceed. A time out procedure was then performed.  Subsequently, fluoroscopy was utilized to identify the L3-L4 interspace, and a small mark was placed on the patient's skin.  The patient was then prepped and draped in the usual sterile fashion. A small amount of 1% lidocaine was infiltrated to provide local anesthesia.  Under intermittent fluoroscopic guidance a spinal needle was then introduced percutaneously into the spinal canal and CSF was obtained.  Opening pressure was 20 cm of water.  A total of 18 ml of clear CSF was collected in serial vials and sent to the laboratory for testing analysis per orders of  the ER physician. The needle was withdrawn from the patient.  A small bandage was placed on the patient's skin overlying the entry wound.  No significant bleeding, or other immediate complications.  The patient was transferred back to the emergency room in good condition.  IMPRESSION: 1.  Successful uncomplicated fluoroscopic guided lumbar puncture, as above.   Original Report Authenticated By: Trudie Reed, M.D.     Scheduled Meds:  Continuous Infusions: . sodium chloride 50 mL/hr at 10/08/12 1923    Principal Problem:   Aseptic meningitis Active Problems:   Hypokalemia    Time spent: 35 minutes  [facility]  [facility] Pager [phone]. If 8PM-8AM, please contact night-coverage at [email or web address], password [facility] 10/09/2012, 10:25 AM  LOS: 2 days

## 2012-10-09 NOTE — Progress Notes (Addendum)
[facility]    Date of Admission:  10/07/2012   Total days of antibiotics 0        ( 1 dose of ctx and vanco)   ID: Joy Fisher is a 22 y.o. female with HA,meningsmus and no fevers found to have aseptic meningitis with CSF with 470 WBC,  80%L, slightly elevated protein, normal glucose, g/s negative for organism, cultures negative at 36hr.  Principal Problem:   Aseptic meningitis Active Problems:   Hypokalemia    Subjective: Afebrile; headache completely resolved for the past day  Medications:     Objective: Vital signs in last 24 hours: Temp:  [97.4 F (36.3 C)-97.8 F (36.6 C)] 97.8 F (36.6 C) (02/23 0525) Pulse Rate:  [75-88] 88 (02/23 0525) Resp:  [16-22] 16 (02/23 0525) BP: (97-118)/(61-76) 97/61 mmHg (02/23 0525) SpO2:  [96 %-100 %] 98 % (02/23 0525) Physical Exam  Constitutional: oriented to person, place, and time.  appears well-developed and well-nourished. No distress.  HENT:  Mouth/Throat: Oropharynx is clear and moist. No oropharyngeal exudate.  Cardiovascular: Normal rate, regular rhythm and normal heart sounds. Exam reveals no gallop and no friction rub.  No murmur heard.  Pulmonary/Chest: Effort normal and breath sounds normal. No respiratory distress.  no wheezes.  Abdominal: Soft. Bowel sounds are normal.  exhibits no distension. There is no tenderness.  Lymphadenopathy:  no cervical adenopathy.  Skin: Skin is warm and dry. No rash noted. No erythema.  Psychiatric:  normal mood and affect.  behavior is normal.    Lab Results  Recent Labs  10/07/12 1914 10/09/12 0745  WBC 10.1  --   HGB 12.9  --   HCT 38.2  --   NA 139 139  K 3.4* 4.0  CL 104 107  CO2 24 22  BUN 7 11  CREATININE 0.60 0.58    Microbiology: 2/22 csf ngtd 2/22 HIV nonreactive  Studies/Results: Ct Head Wo Contrast  10/07/2012  *RADIOLOGY REPORT*  Clinical Data: Headache, nausea and emesis.  CT HEAD WITHOUT CONTRAST  Technique:   Contiguous axial images were obtained from the base of the skull through the vertex without contrast.  Comparison: No priors.  Findings: No acute intracranial abnormalities.  Specifically, no evidence of acute intracranial hemorrhage, no definite findings of acute/subacute cerebral ischemia, no mass, mass effect, hydrocephalus or abnormal intra or extra-axial fluid collections. Visualized paranasal sinuses and mastoids are well pneumatized.  No acute displaced skull fractures are identified.  IMPRESSION: 1.  No acute intracranial abnormality. 2.  The appearance of the brain is normal.   Original Report Authenticated By: Trudie Reed, M.D.    Dg Lumbar Puncture Fluoro Guide  10/08/2012  *RADIOLOGY REPORT*  Clinical Data: Severe headache and neck stiffness.  LUMBAR PUNCTURE FLUORO GUIDE  Comparison: No priors.  Findings: Before beginning the procedure, the procedure was discussed with the patient.  The risks, benefits and alternatives of the procedure were explained, the patient voiced understanding of the discussion.  The patient gave both written and verbal consent to proceed. A time out procedure was then performed.  Subsequently, fluoroscopy was utilized to identify the L3-L4 interspace, and a small mark was placed on the patient's skin.  The patient was then prepped and draped in the usual sterile fashion. A small amount of 1% lidocaine was infiltrated to provide local anesthesia.  Under intermittent fluoroscopic guidance a spinal needle was then introduced percutaneously into the spinal canal and CSF was obtained.  Opening pressure was  20 cm of water.  A total of 18 ml of clear CSF was collected in serial vials and sent to the laboratory for testing analysis per orders of the ER physician. The needle was withdrawn from the patient.  A small bandage was placed on the patient's skin overlying the entry wound.  No significant bleeding, or other immediate complications.  The patient was transferred back to the  emergency room in good condition.  IMPRESSION: 1.  Successful uncomplicated fluoroscopic guided lumbar puncture, as above.   Original Report Authenticated By: Trudie Reed, M.D.      Assessment/Plan: Aseptic meningitis = cause unknown at this stage. Still has some pending labs (inc. HSV pcr, VDRL) continue with supportive care at this point. Will decide to change therapy depending on results of outstanding labs. Anticipate that the infectious work up will be negative. Can discontinue droplet precautions  Drue Second [facility] Cell: [phone] Pager: [phone]  10/09/2012, 10:17 AM

## 2012-10-10 LAB — HERPES SIMPLEX VIRUS(HSV) DNA BY PCR
HSV 1 DNA: NOT DETECTED
HSV 2 DNA: DETECTED

## 2012-10-10 MED ORDER — ZOLPIDEM TARTRATE 5 MG PO TABS
5.0000 mg | ORAL_TABLET | Freq: Once | ORAL | Status: AC
  Administered 2012-10-10: 5 mg via ORAL
  Filled 2012-10-10: qty 1

## 2012-10-10 MED ORDER — INFLUENZA VIRUS VACC SPLIT PF IM SUSP
0.5000 mL | Freq: Once | INTRAMUSCULAR | Status: AC
  Administered 2012-10-10: 0.5 mL via INTRAMUSCULAR
  Filled 2012-10-10: qty 0.5

## 2012-10-10 NOTE — Discharge Summary (Signed)
Physician Discharge Summary  Monserath Neff ZOX:[id number] DOB: 06-25-1991 DOA: 10/07/2012  PCP: Default, Provider, MD  Admit date: 10/07/2012 Discharge date: 10/10/2012  Time spent: 40 minutes  Recommendations for Outpatient Follow-up:  1. Follow up with student health clinic in [employer]  Discharge Diagnoses:  Principal Problem:   Aseptic meningitis Active Problems:   Hypokalemia   Discharge Condition: Stable  Diet recommendation: Regular  Filed Weights   10/08/12 0620  Weight: 84.7 kg (186 lb 11.7 oz)    History of present illness:  Joy Fisher is a 22 y.o. female with no PMH, student at [employer], presented to ED on 10/07/12 with one-day history of headache, neck pain and photophobia. She was treated for URTI with a 4 day course of unknown antibiotic approximately a week ago. She then traveled with her classmates to [facility] on a trip for 4 days. She denies sickly contacts. While on the plane returning back home on Thursday night, she started experiencing gradual onset of frontal headache which progressively got worse yesterday to 8/10 in severity and radiated to the rest of the head, neck and back. This was associated with photophobia. No fever or chills. One episode of vomiting which was nonprojectile and did not have coffee-ground or blood. A dose of Excedrin Migraine temporarily eased her symptoms but returned. She denies earache, sore throat, cough, skin rash or insect bites. She presented to the ED where she was afebrile, no leukocytosis, CT head negative. Lumbar puncture was done by IR under fluoroscopy which shows  Glucose 58, protein 76, RBC 215 on first tube and 13 on the fourth 2, WBC 470 predominant lymphocytes and monocytes. Gram stain was negative for organisms. Hospitalist service is requested to admit for further evaluation and management. Patient says that her headache is much better. She complains of mild pain at the LP site. ID was consulted by EDP.   Hospital  Course:   1. Aseptic meningitis: Patient presents to the hospital with fever, headache and photophobia as well as neck pain. Patient was treated for upper respiratory tract infection 4 days prior to admission. Patient denies any recent sick contacts. At admission to the hospital LP was done and showed elevated proteins of 76, and WBCs of 490 which is mainly lymphocytes. Which is consistent with aseptic meningitis, patient admitted to the hospital and she was empirically on vancomycin Rocephin, and fissure disease was consulted and they recommended initially to wait on the CSF HSV PCR, but patient improved quickly and she is back to her baseline. I spoke today with Dr. Cliffton Asters of infectious disease and he okayed her discharge. Patient is not supposed to be discharged on any antibiotics or antivirals.  2. Hypokalemia: Replete with oral supplements, this is resolved.  3. Headache: Likely secondary to the meningitis, this is resolved too.  Procedures:  LP done under fluoroscopy guidance by Dr. Llana Aliment on 10/08/2012.  Consultations:  Infectious disease  Discharge Exam: Filed Vitals:   10/09/12 0525 10/09/12 1527 10/09/12 2105 10/10/12 0539  BP: 97/61 109/69 112/70 95/53  Pulse: 88 81 71 63  Temp: 97.8 F (36.6 C) 97.6 F (36.4 C) 97.4 F (36.3 C) 98 F (36.7 C)  TempSrc: Oral Oral Oral Oral  Resp: 16 16 16 16   Height:      Weight:      SpO2: 98% 98% 99% 98%   General: Alert and awake, oriented x3, not in any acute distress. HEENT: anicteric sclera, pupils reactive to light and accommodation, EOMI CVS: S1-S2 clear,  no murmur rubs or gallops Chest: clear to auscultation bilaterally, no wheezing, rales or rhonchi Abdomen: soft nontender, nondistended, normal bowel sounds, no organomegaly Extremities: no cyanosis, clubbing or edema noted bilaterally Neuro: Cranial nerves II-XII intact, no focal neurological deficits  Discharge Instructions  Discharge Orders   Future Orders  Complete By Expires     Increase activity slowly  As directed         Medication List    STOP taking these medications       aspirin-acetaminophen-caffeine 250-250-65 MG per tablet  Commonly known as:  EXCEDRIN MIGRAINE           Follow-up Information   Follow up with [facility] student clinic In 1 week.       The results of significant diagnostics from this hospitalization (including imaging, microbiology, ancillary and laboratory) are listed below for reference.    Significant Diagnostic Studies: Ct Head Wo Contrast  10/07/2012  *RADIOLOGY REPORT*  Clinical Data: Headache, nausea and emesis.  CT HEAD WITHOUT CONTRAST  Technique:  Contiguous axial images were obtained from the base of the skull through the vertex without contrast.  Comparison: No priors.  Findings: No acute intracranial abnormalities.  Specifically, no evidence of acute intracranial hemorrhage, no definite findings of acute/subacute cerebral ischemia, no mass, mass effect, hydrocephalus or abnormal intra or extra-axial fluid collections. Visualized paranasal sinuses and mastoids are well pneumatized.  No acute displaced skull fractures are identified.  IMPRESSION: 1.  No acute intracranial abnormality. 2.  The appearance of the brain is normal.   Original Report Authenticated By: Trudie Reed, M.D.    Dg Lumbar Puncture Fluoro Guide  10/08/2012  *RADIOLOGY REPORT*  Clinical Data: Severe headache and neck stiffness.  LUMBAR PUNCTURE FLUORO GUIDE  Comparison: No priors.  Findings: Before beginning the procedure, the procedure was discussed with the patient.  The risks, benefits and alternatives of the procedure were explained, the patient voiced understanding of the discussion.  The patient gave both written and verbal consent to proceed. A time out procedure was then performed.  Subsequently, fluoroscopy was utilized to identify the L3-L4 interspace, and a small mark was placed on the patient's skin.  The patient was then  prepped and draped in the usual sterile fashion. A small amount of 1% lidocaine was infiltrated to provide local anesthesia.  Under intermittent fluoroscopic guidance a spinal needle was then introduced percutaneously into the spinal canal and CSF was obtained.  Opening pressure was 20 cm of water.  A total of 18 ml of clear CSF was collected in serial vials and sent to the laboratory for testing analysis per orders of the ER physician. The needle was withdrawn from the patient.  A small bandage was placed on the patient's skin overlying the entry wound.  No significant bleeding, or other immediate complications.  The patient was transferred back to the emergency room in good condition.  IMPRESSION: 1.  Successful uncomplicated fluoroscopic guided lumbar puncture, as above.   Original Report Authenticated By: Trudie Reed, M.D.     Microbiology: Recent Results (from the past 240 hour(s))  GRAM STAIN     Status: None   Collection Time    10/08/12  2:54 AM      Result Value Range Status   Specimen Description CSF   Final   Special Requests NONE   Final   Gram Stain     Final   Value: CYTOSPIN SAMPLE     WBC PRESENT,BOTH PMN AND MONONUCLEAR  NO ORGANISMS SEEN   Report Status 10/08/2012 FINAL   Final  CSF CULTURE     Status: None   Collection Time    10/08/12  3:03 AM      Result Value Range Status   Specimen Description CSF   Final   Special Requests Normal   Final   Gram Stain     Final   Value: CYTOSPIN WBC PRESENT, PREDOMINANTLY MONONUCLEAR     NO ORGANISMS SEEN   Culture NO GROWTH 2 DAYS   Final   Report Status PENDING   Incomplete  MRSA PCR SCREENING     Status: None   Collection Time    10/08/12  6:30 AM      Result Value Range Status   MRSA by PCR NEGATIVE  NEGATIVE Final   Comment:            The GeneXpert MRSA Assay (FDA     approved for NASAL specimens     only), is one component of a     comprehensive MRSA colonization     surveillance program. It is not      intended to diagnose MRSA     infection nor to guide or     monitor treatment for     MRSA infections.     Labs: Basic Metabolic Panel:  Recent Labs Lab 10/07/12 1914 10/09/12 0745  NA 139 139  K 3.4* 4.0  CL 104 107  CO2 24 22  GLUCOSE 130* 96  BUN 7 11  CREATININE 0.60 0.58  CALCIUM 8.7 8.6   Liver Function Tests: No results found for this basename: AST, ALT, ALKPHOS, BILITOT, PROT, ALBUMIN,  in the last 168 hours No results found for this basename: LIPASE, AMYLASE,  in the last 168 hours No results found for this basename: AMMONIA,  in the last 168 hours CBC:  Recent Labs Lab 10/07/12 1914  WBC 10.1  NEUTROABS 8.3*  HGB 12.9  HCT 38.2  MCV 94.1  PLT 300   Cardiac Enzymes: No results found for this basename: CKTOTAL, CKMB, CKMBINDEX, TROPONINI,  in the last 168 hours BNP: BNP (last 3 results) No results found for this basename: PROBNP,  in the last 8760 hours CBG: No results found for this basename: GLUCAP,  in the last 168 hours     Signed:  Hartwell Vandiver A  [facility] 10/10/2012, 1:31 PM

## 2012-10-10 NOTE — Progress Notes (Signed)
Patient discharge teaching given, including activity, diet, follow-up appoints, and medications. Patient verbalized understanding of all discharge instructions. IV access was d/c'd. Vitals are stable. Skin is intact except as charted in most recent assessments. Pt to be escorted out by NT, to be driven home by family. 

## 2012-10-10 NOTE — Care Management Note (Signed)
    Page 1 of 1   10/10/2012     10:48:54 AM   CARE MANAGEMENT NOTE 10/10/2012  Patient:  Joy Fisher, Joy Fisher   Account Number:  [id number]  Date Initiated:  10/10/2012  Documentation initiated by:  Letha Cape  Subjective/Objective Assessment:   dx aseptic meningitis  admit- lives with a friend.  pta indep.     Action/Plan:   Anticipated DC Date:  10/10/2012   Anticipated DC Plan:  HOME/SELF CARE      DC Planning Services  CM consult      Choice offered to / List presented to:             Status of service:  Completed, signed off Medicare Important Message given?   (If response is "NO", the following Medicare IM given date fields will be blank) Date Medicare IM given:   Date Additional Medicare IM given:    Discharge Disposition:  HOME/SELF CARE  Per UR Regulation:  Reviewed for med. necessity/level of care/duration of stay  If discussed at Long Length of Stay Meetings, dates discussed:    Comments:  10/10/12 10:47 Letha Cape RN, BSN [phone] patient is indep. She states she has insurance for medication coverage and she has transportaiton at [employer].  No needs anticipated.

## 2012-10-10 NOTE — Progress Notes (Signed)
Pt found with electronic heating pad from home at shift change. Benedetto Coons, NP called and orders given for K pad. Pt has put electronic heating pad away and is now using the K pad instead. Julien Nordmann [facility]

## 2012-10-11 LAB — CSF CULTURE W GRAM STAIN
Culture: NO GROWTH
Special Requests: NORMAL

## 2012-10-12 LAB — HSV(HERPES SMPLX VRS)ABS-I+II(IGG)-CSF

## 2013-09-10 IMAGING — CT CT HEAD W/O CM
1 of 2 series · 13 of 30 positions shown, 17 images · non-contrast
Comparison: No priors.

CLINICAL DATA: Headache, nausea and emesis.

CT HEAD WITHOUT CONTRAST
TECHNIQUE: Contiguous axial images were obtained from the base of
the skull through the vertex without contrast.

[Series 2: brain · axial · 0.47mm/px · z∈[+128,+256]mm · 13 of 28 slices shown, 17 images]
[im 2/28  brain]
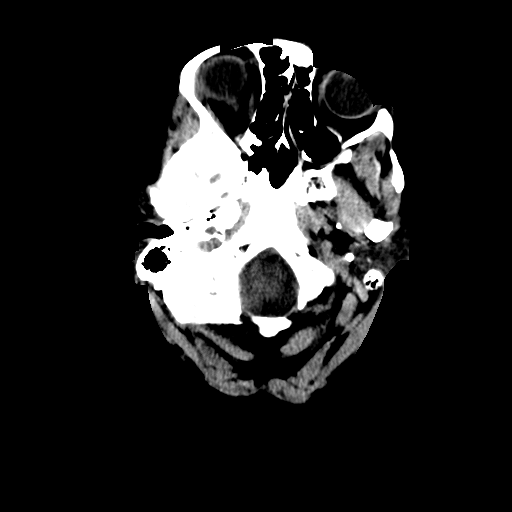
[im 2/28  bone]
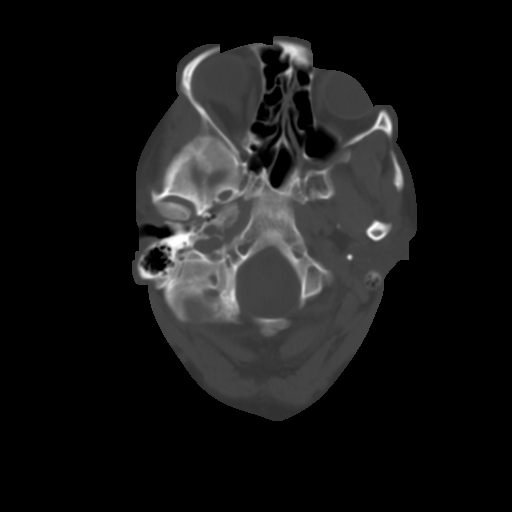
[im 4/28  brain]
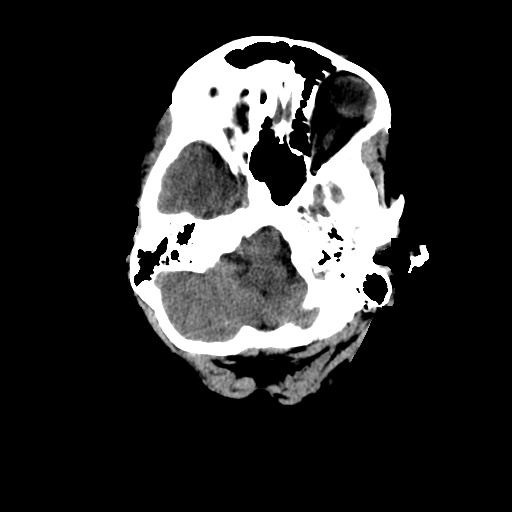
[im 6/28  brain]
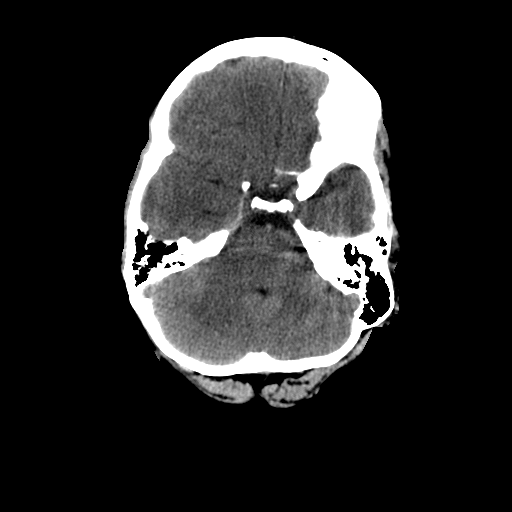
[im 8/28  brain]
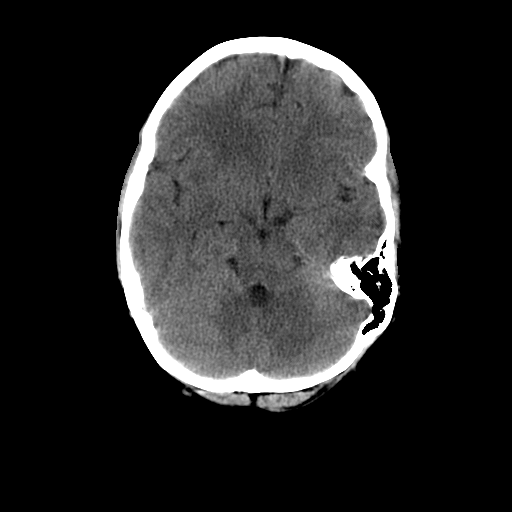
[im 10/28  brain]
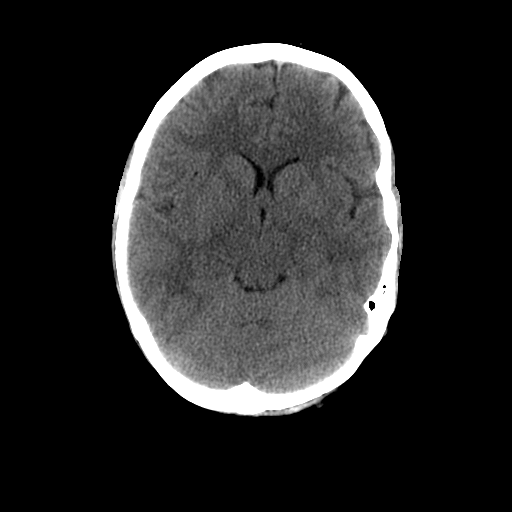
[im 10/28  bone]
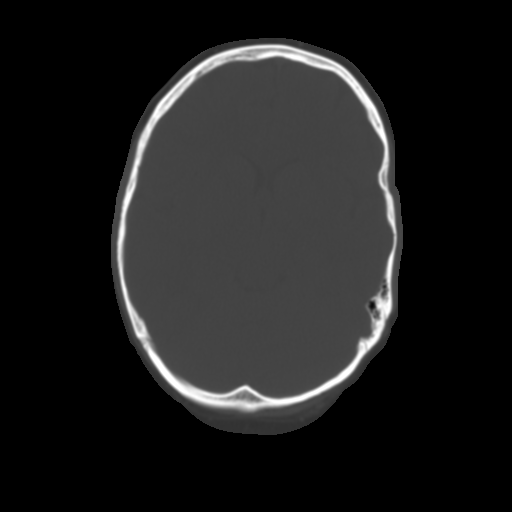
[im 12/28  brain]
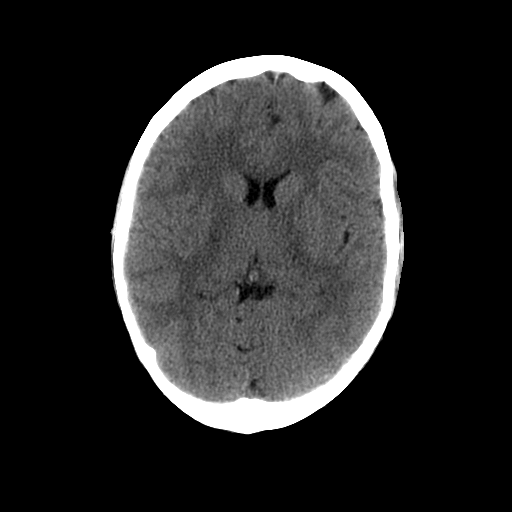
[im 14/28  brain]
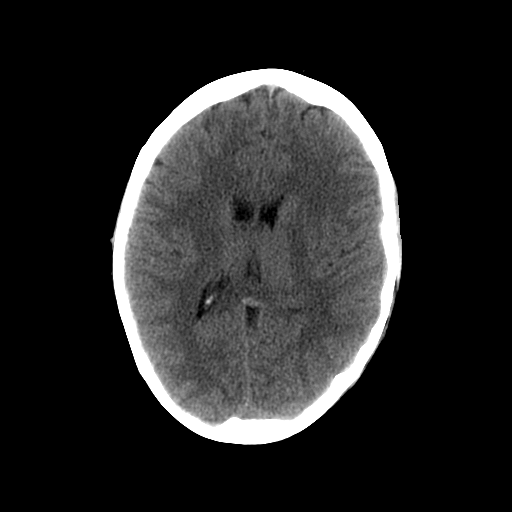
[im 16/28  brain]
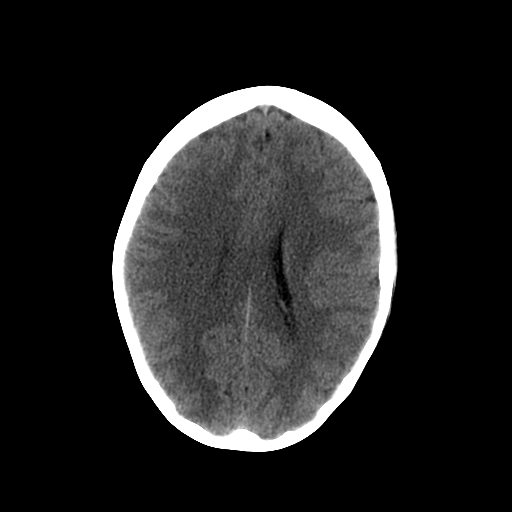
[im 18/28  brain]
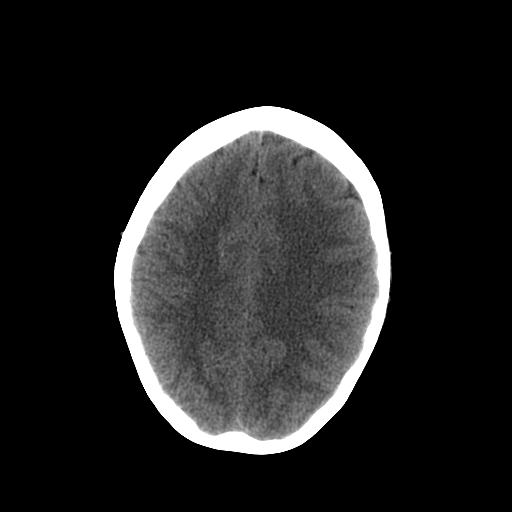
[im 18/28  bone]
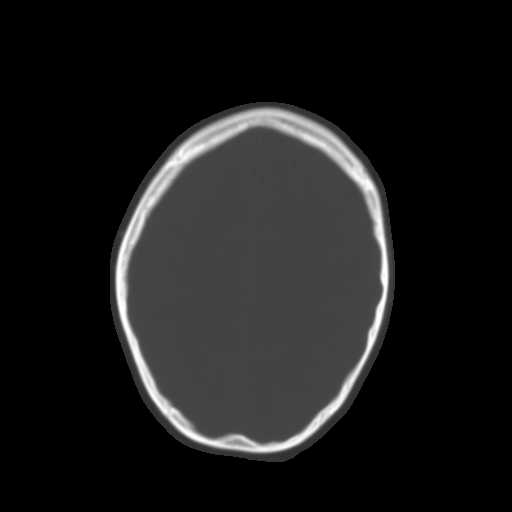
[im 20/28  brain]
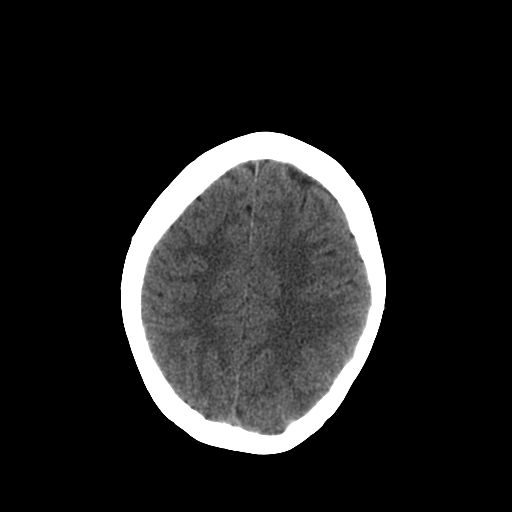
[im 22/28  brain]
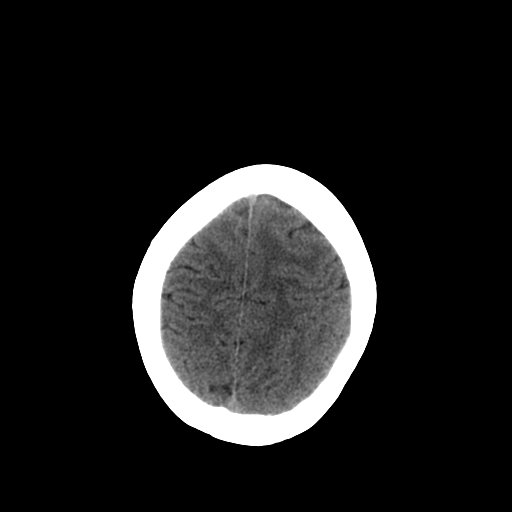
[im 24/28  brain]
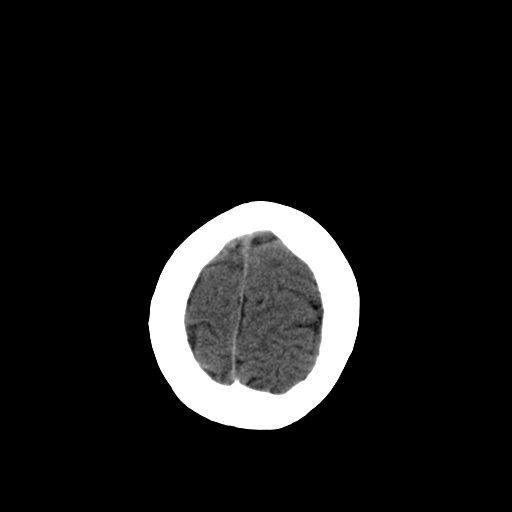
[im 26/28  brain]
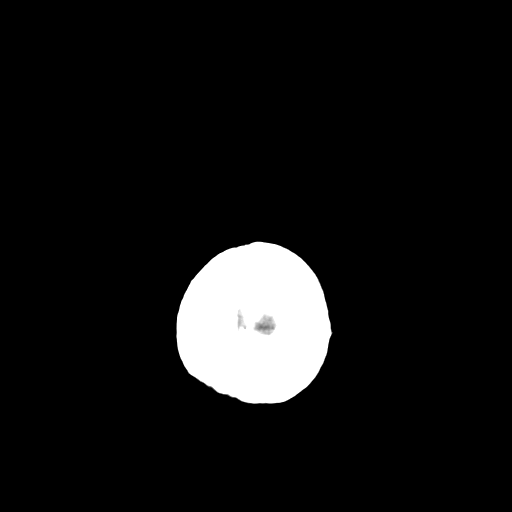
[im 26/28  bone]
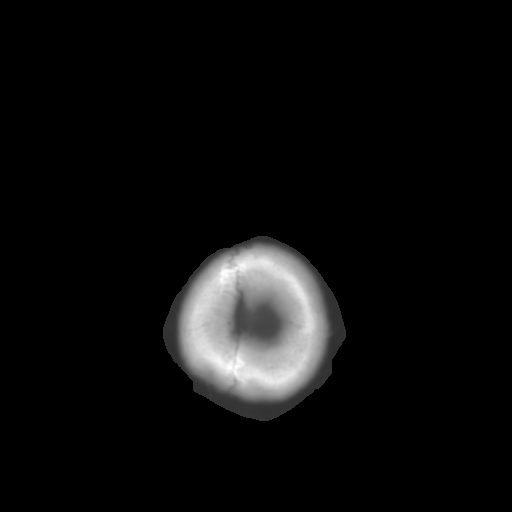

[13 of 30 positions shown; findings below may reference images not displayed]

FINDINGS: No acute intracranial abnormalities.  Specifically, no
evidence of acute intracranial hemorrhage, no definite findings of
acute/subacute cerebral ischemia, no mass, mass effect,
hydrocephalus or abnormal intra or extra-axial fluid collections.
Visualized paranasal sinuses and mastoids are well pneumatized.  No
acute displaced skull fractures are identified.
IMPRESSION: 1.  No acute intracranial abnormality.
2.  The appearance of the brain is normal.

## 2015-03-23 ENCOUNTER — Emergency Department (HOSPITAL_COMMUNITY)
Admission: EM | Admit: 2015-03-23 | Discharge: 2015-03-23 | Disposition: A | Discharge status: Home / Self Care | Payer: Self-pay | Attending: Emergency Medicine | Admitting: Emergency Medicine

## 2015-03-23 ENCOUNTER — Encounter (HOSPITAL_COMMUNITY): Payer: Self-pay | Admitting: *Deleted

## 2015-03-23 DIAGNOSIS — IMO0002 Reserved for concepts with insufficient information to code with codable children: Secondary | ICD-10-CM

## 2015-03-23 DIAGNOSIS — L02211 Cutaneous abscess of abdominal wall: Secondary | ICD-10-CM | POA: Insufficient documentation

## 2015-03-23 LAB — BASIC METABOLIC PANEL
Anion gap: 8 (ref 5–15)
BUN: 15 mg/dL (ref 6–20)
CO2: 21 mmol/L — AB (ref 22–32)
CREATININE: 0.76 mg/dL (ref 0.44–1.00)
Calcium: 8.9 mg/dL (ref 8.9–10.3)
Chloride: 108 mmol/L (ref 101–111)
GFR calc Af Amer: >60 mL/min (ref >60)
Glucose, Bld: 112 mg/dL — ABNORMAL HIGH (ref 65–99)
Potassium: 4 mmol/L (ref 3.5–5.1)
SODIUM: 137 mmol/L (ref 135–145)

## 2015-03-23 LAB — CBC WITH DIFFERENTIAL/PLATELET
BASOS PCT: 0 % (ref 0–1)
Basophils Absolute: 0 10*3/uL (ref 0.0–0.1)
Eosinophils Absolute: 0.2 10*3/uL (ref 0.0–0.7)
Eosinophils Relative: 2 % (ref 0–5)
HCT: 35.8 % — ABNORMAL LOW (ref 36.0–46.0)
HEMOGLOBIN: 11.9 g/dL — AB (ref 12.0–15.0)
LYMPHS ABS: 2.8 10*3/uL (ref 0.7–4.0)
LYMPHS PCT: 26 % (ref 12–46)
MCH: 31.2 pg (ref 26.0–34.0)
MCHC: 33.2 g/dL (ref 30.0–36.0)
MCV: 93.7 fL (ref 78.0–100.0)
MONO ABS: 1 10*3/uL (ref 0.1–1.0)
Monocytes Relative: 9 % (ref 3–12)
Neutro Abs: 6.7 10*3/uL (ref 1.7–7.7)
Neutrophils Relative %: 63 % (ref 43–77)
PLATELETS: 351 10*3/uL (ref 150–400)
RBC: 3.82 MIL/uL — ABNORMAL LOW (ref 3.87–5.11)
RDW: 12.5 % (ref 11.5–15.5)
WBC: 10.6 10*3/uL — ABNORMAL HIGH (ref 4.0–10.5)

## 2015-03-23 MED ORDER — TRAMADOL HCL 50 MG PO TABS: 50.0000 mg | ORAL_TABLET | Freq: Four times a day (QID) | ORAL | Status: AC | PRN

## 2015-03-23 MED ORDER — CEPHALEXIN 500 MG PO CAPS: 500.0000 mg | ORAL_CAPSULE | Freq: Four times a day (QID) | ORAL | Status: AC

## 2015-03-23 MED ORDER — LIDOCAINE HCL (PF) 1 % IJ SOLN
10.0000 mL | Freq: Once | INTRAMUSCULAR | Status: AC
  Administered 2015-03-23: 5 mL
  Filled 2015-03-23: qty 10

## 2015-03-23 MED ORDER — LIDOCAINE HCL 1 % IJ SOLN
INTRAMUSCULAR | Status: AC
  Administered 2015-03-23: 23:00:00
  Filled 2015-03-23: qty 20

## 2015-03-23 NOTE — Discharge Instructions (Signed)

## 2015-03-23 NOTE — ED Provider Notes (Signed)
CSN: [id number]     Arrival date & time 03/23/15  2131 History   First MD Initiated Contact with Patient 03/23/15 2208     Chief Complaint  Patient presents with  . Abscess     (Consider location/radiation/quality/duration/timing/severity/associated sxs/prior Treatment) HPI Comments: This is a 24 year old female who presents emergency Department with chief complaint of abscess to the right lower quadrant of the abdomen. Patient states it began as a pimple on Monday. Progressively enlarged. She went to a minute clinic at [facility] on Thursday, 3 days ago and was started on Bactrim. Patient states that she had a bandage over it and opened it began draining yesterday. Patient came in today because it continued to be painful. She felt like the redness surrounding the area was spreading.    Patient is a 24 y.o. female presenting with abscess. The history is provided by the patient.  Abscess Location:  Torso Torso abscess location:  Abd RLQ Size:   4 cm Abscess quality: draining, induration, painful, redness and warmth   Red streaking: no   Duration:  5 days Progression:  Partially resolved Pain details:    Quality:  Aching and hot   Severity:  Moderate   Timing:  Constant   Progression:  Partially resolved Chronicity:  Recurrent Context: not diabetes, not immunosuppression, not injected drug use and not insect bite/sting   Context comment:  Recurrent skin infections Relieved by:  Nothing Ineffective treatments: bactrim. Associated symptoms: no anorexia   Risk factors: prior abscess        Past Medical History  Diagnosis Date  . Medical history non-contributory    Past Surgical History  Procedure Laterality Date  . No past surgeries     Family History  Problem Relation Age of Onset  . CVA Father    History  Substance Use Topics  . Smoking status: Never Smoker   . Smokeless tobacco: Not on file  . Alcohol Use: Yes     Comment: socially   OB History    No data available     Review of Systems  Gastrointestinal: Negative for anorexia.      Allergies  Review of patient's allergies indicates no known allergies.  Home Medications   Prior to Admission medications   Not on File   BP 115/101 mmHg  Pulse 117  Temp(Src) 98.1 F (36.7 C) (Oral)  Resp 18  Ht 5\' 5"  (1.651 m)  Wt 220 lb (99.791 kg)  BMI 36.61 kg/m2  LMP 02/25/2015 Physical Exam  Constitutional: She is oriented to person, place, and time. She appears well-developed and well-nourished. No distress.  HENT:  Head: Normocephalic and atraumatic.  Eyes: Conjunctivae are normal. No scleral icterus.  Neck: Normal range of motion.  Cardiovascular: Normal rate, regular rhythm and normal heart sounds.  Exam reveals no gallop and no friction rub.   No murmur heard. Pulmonary/Chest: Effort normal and breath sounds normal. No respiratory distress.  Abdominal: Soft. Bowel sounds are normal. She exhibits no distension and no mass. There is tenderness. There is no guarding.  4 cm abdominal abscess with central necrosis. 10 cm of surrounding cellulitis  Neurological: She is alert and oriented to person, place, and time.  Skin: Skin is warm and dry. She is not diaphoretic.  Nursing note and vitals reviewed.     ED Course  INCISION AND DRAINAGE Date/Time: 03/23/2015 11:49 PM Performed by: Arthor Captain Authorized by: Arthor Captain Consent: Verbal consent obtained. Consent given by: patient Patient identity confirmed: provided demographic  data Type: abscess Anesthesia: local infiltration Local anesthetic: lidocaine 2% without epinephrine Anesthetic total: 10 ml Patient sedated: no Complexity: simple Wound treatment: wound left open   (including critical care time) Labs Review Labs Reviewed  BASIC METABOLIC PANEL - Abnormal; Notable for the following:    CO2 21 (*)    Glucose, Bld 112 (*)    All other components within normal limits  CBC WITH DIFFERENTIAL/PLATELET - Abnormal; Notable  for the following:    WBC 10.6 (*)    RBC 3.82 (*)    Hemoglobin 11.9 (*)    HCT 35.8 (*)    All other components within normal limits    Imaging Review No results found.   EKG Interpretation None      MDM   Final diagnoses:  None    11:49 PM BP 126/73 mmHg  Pulse 83  Temp(Src) 98.3 F (36.8 C) (Oral)  Resp 18  Ht  (1.651 m)  Wt 220 lb (99.791 kg)  BMI 36.61 kg/m2  SpO2 98%  LMP 02/25/2015 Patient with skin abscess amenable to incision and drainage.  Abscess was not large enough to warrant packing or drain,  wound recheck in 2 days. Encouraged home warm soaks and flushing.  Mild signs of cellulitis is surrounding skin.  Will d/c to home.  Add Keflex at discharge.     Arthor Captain, PA-C 03/23/15 2350  Richardean Canal, MD 03/25/15 [phone]

## 2015-03-23 NOTE — ED Notes (Signed)
Pt states that she has had an abscess to her abd that began on Monday; pt states that she was seen at the [facility] on Thurs and was prescribed Bactrim; pt states that the area opened yesterday and began to drain; pt is concerned about infection
